# Patient Record
Sex: Male | Born: 1960 | Race: White | Hispanic: No | Smoking: Current every day smoker
Health system: Southern US, Community
[De-identification: ages and names within clinical notes are randomized; demographics above are authoritative.]

## PROBLEM LIST (undated history)

## (undated) DIAGNOSIS — I10 Essential (primary) hypertension: Secondary | ICD-10-CM

## (undated) DIAGNOSIS — E119 Type 2 diabetes mellitus without complications: Secondary | ICD-10-CM

## (undated) HISTORY — PX: HERNIA REPAIR: SHX51

## (undated) HISTORY — PX: REPAIR / RESECT / TRANSPLANT BICEPS TENDON: SUR1150

## (undated) HISTORY — PX: CORONARY ANGIOPLASTY WITH STENT PLACEMENT: SHX49

---

## 1997-08-26 ENCOUNTER — Ambulatory Visit (HOSPITAL_COMMUNITY): Admission: RE | Admit: 1997-08-26 | Discharge: 1997-08-26 | Payer: Self-pay | Admitting: Gastroenterology

## 1997-10-07 ENCOUNTER — Ambulatory Visit (HOSPITAL_COMMUNITY): Admission: RE | Admit: 1997-10-07 | Discharge: 1997-10-07 | Payer: Self-pay | Admitting: Gastroenterology

## 1999-06-16 ENCOUNTER — Encounter: Payer: Self-pay | Admitting: Emergency Medicine

## 1999-06-16 ENCOUNTER — Emergency Department (HOSPITAL_COMMUNITY): Admission: EM | Admit: 1999-06-16 | Discharge: 1999-06-16 | Payer: Self-pay | Admitting: Emergency Medicine

## 2000-05-13 ENCOUNTER — Ambulatory Visit (HOSPITAL_COMMUNITY): Admission: RE | Admit: 2000-05-13 | Discharge: 2000-05-13 | Payer: Self-pay | Admitting: Emergency Medicine

## 2000-05-13 ENCOUNTER — Encounter: Payer: Self-pay | Admitting: Emergency Medicine

## 2000-05-24 ENCOUNTER — Ambulatory Visit (HOSPITAL_COMMUNITY): Admission: RE | Admit: 2000-05-24 | Discharge: 2000-05-24 | Payer: Self-pay | Admitting: Urology

## 2005-02-16 ENCOUNTER — Ambulatory Visit: Payer: Self-pay | Admitting: Specialist

## 2005-03-02 ENCOUNTER — Ambulatory Visit: Payer: Self-pay | Admitting: Specialist

## 2011-08-03 ENCOUNTER — Telehealth: Payer: Self-pay

## 2011-08-03 NOTE — Telephone Encounter (Signed)
Previous note from 08/03/11 was an error and was documented on wrong pt

## 2011-08-03 NOTE — Telephone Encounter (Signed)
Pt called to cx appt scheduled for 7/12 with Dr. Mariah Milling.  He says he has appt with another cardiologist, Dr. Gwen Pounds, 08/17/11. Appt cx'd

## 2014-04-28 ENCOUNTER — Encounter: Payer: Self-pay | Admitting: Gastroenterology

## 2014-05-19 ENCOUNTER — Ambulatory Visit: Payer: Self-pay | Admitting: Nurse Practitioner

## 2014-08-12 ENCOUNTER — Encounter: Payer: Self-pay | Admitting: Gastroenterology

## 2014-09-09 ENCOUNTER — Ambulatory Visit: Payer: Self-pay | Admitting: Nurse Practitioner

## 2014-09-23 ENCOUNTER — Ambulatory Visit: Payer: Self-pay | Admitting: Nurse Practitioner

## 2014-09-23 ENCOUNTER — Telehealth: Payer: Self-pay | Admitting: Nurse Practitioner

## 2014-09-23 NOTE — Telephone Encounter (Signed)
Noted  

## 2014-09-23 NOTE — Telephone Encounter (Signed)
PATIENT DID NOT WANT TO HAVE APPOINTMENT AND FILE HIS INSURANCE.  DID NOT THINK HE SHOULD BE BILLED FOR A CONSULT WHEN HE WAS DECIDING ON IF HE WANTED TREATMENT. SAID HE HAD NOTHING ACTIVE AND WOULD TALK TO HIS PCP

## 2014-09-24 NOTE — Telephone Encounter (Signed)
Noted  

## 2017-03-23 ENCOUNTER — Ambulatory Visit
Admission: EM | Admit: 2017-03-23 | Discharge: 2017-03-23 | Disposition: A | Discharge status: Home / Self Care | Payer: Worker's Compensation

## 2017-11-29 ENCOUNTER — Encounter: Payer: Self-pay | Admitting: Emergency Medicine

## 2017-11-29 ENCOUNTER — Ambulatory Visit
Admission: EM | Admit: 2017-11-29 | Discharge: 2017-11-29 | Disposition: A | Discharge status: Home / Self Care | Payer: Managed Care, Other (non HMO) | Attending: Family Medicine | Admitting: Family Medicine

## 2017-11-29 ENCOUNTER — Other Ambulatory Visit: Payer: Self-pay

## 2017-11-29 ENCOUNTER — Ambulatory Visit (INDEPENDENT_AMBULATORY_CARE_PROVIDER_SITE_OTHER): Payer: Managed Care, Other (non HMO)

## 2017-11-29 DIAGNOSIS — R1012 Left upper quadrant pain: Secondary | ICD-10-CM

## 2017-11-29 HISTORY — DX: Type 2 diabetes mellitus without complications: E11.9

## 2017-11-29 HISTORY — DX: Essential (primary) hypertension: I10

## 2017-11-29 LAB — URINALYSIS, COMPLETE (UACMP) WITH MICROSCOPIC
Bacteria, UA: NONE SEEN
Bilirubin Urine: NEGATIVE
Glucose, UA: 500 mg/dL — AB
HGB URINE DIPSTICK: NEGATIVE
KETONES UR: NEGATIVE mg/dL
Leukocytes, UA: NEGATIVE
Nitrite: NEGATIVE
PROTEIN: NEGATIVE mg/dL
RBC / HPF: NONE SEEN RBC/hpf (ref 0–5)
Specific Gravity, Urine: 1.015 (ref 1.005–1.030)
WBC UA: NONE SEEN WBC/hpf (ref 0–5)
pH: 8.5 — ABNORMAL HIGH (ref 5.0–8.0)

## 2017-11-29 LAB — COMPREHENSIVE METABOLIC PANEL
ALBUMIN: 3.9 g/dL (ref 3.5–5.0)
ALT: 98 U/L — ABNORMAL HIGH (ref 0–44)
AST: 72 U/L — AB (ref 15–41)
Alkaline Phosphatase: 59 U/L (ref 38–126)
Anion gap: 9 (ref 5–15)
BUN: 17 mg/dL (ref 6–20)
CHLORIDE: 100 mmol/L (ref 98–111)
CO2: 26 mmol/L (ref 22–32)
Calcium: 9.1 mg/dL (ref 8.9–10.3)
Creatinine, Ser: 1.11 mg/dL (ref 0.61–1.24)
GFR calc Af Amer: >60 mL/min (ref >60)
GLUCOSE: 273 mg/dL — AB (ref 70–99)
POTASSIUM: 4 mmol/L (ref 3.5–5.1)
Sodium: 135 mmol/L (ref 135–145)
Total Bilirubin: 1.1 mg/dL (ref 0.3–1.2)
Total Protein: 7 g/dL (ref 6.5–8.1)

## 2017-11-29 LAB — LIPASE, BLOOD: Lipase: 48 U/L (ref 11–51)

## 2017-11-29 NOTE — ED Triage Notes (Signed)
Pt c/o LUQ abdominal pain that radiates through to his back and into his groin. He also has nausea, and vomiting. He reports that this morning he had pain that doubled him over but the pain has been getting worse over the last 2-3 days. He thought the pain was coming from his Januvia so he stopped it a couple of days ago.

## 2017-11-29 NOTE — ED Provider Notes (Signed)
[facility]    CSN: [id number] Arrival date & time: 11/29/17  1456     History   Chief Complaint Chief Complaint  Patient presents with  . Abdominal Pain  . Back Pain    HPI Lucas Gregory is a 57 y.o. male.   57 yo male with a c/o left upper abdominal pain associated with nausea and worsening over the last 2-3 days. Denies any fevers, chills, dysuria, hematuria, diarrhea, constipation, injuries. States he thinks this is side effect from Januvia medication that he takes.   The history is provided by the patient.  Abdominal Pain  Back Pain  Associated symptoms: abdominal pain     Past Medical History:  Diagnosis Date  . Diabetes mellitus without complication (HCC)   . Hypertension     There are no active problems to display for this patient.   Past Surgical History:  Procedure Laterality Date  . CORONARY ANGIOPLASTY WITH STENT PLACEMENT    . HERNIA REPAIR    . REPAIR / RESECT / TRANSPLANT BICEPS TENDON         Home Medications    Prior to Admission medications   Medication Sig Start Date End Date Taking? Authorizing Provider  atorvastatin (LIPITOR) 40 MG tablet  10/31/17  Yes [provider]  enalapril (VASOTEC) 20 MG tablet  10/31/17  Yes [provider]  glyBURIDE (DIABETA) 5 MG tablet  10/31/17  Yes [provider]  metFORMIN (GLUCOPHAGE) 500 MG tablet  10/31/17  Yes [provider]  JANUVIA 100 MG tablet  09/29/17   [provider]    Family History Family History  Problem Relation Age of Onset  . Cancer Mother   . Stroke Father     Social History Social History   Tobacco Use  . Smoking status: Current Every Day Smoker    Packs/day: 0.50  . Smokeless tobacco: Never Used  Substance Use Topics  . Alcohol use: Yes    Frequency: Never  . Drug use: Never     Allergies   Pork-derived products; Beef-derived products; and Hepatitis b virus vaccine   Review of Systems Review of Systems    Gastrointestinal: Positive for abdominal pain.  Musculoskeletal: Positive for back pain.     Physical Exam Triage Vital Signs ED Triage Vitals  Enc Vitals Group     BP 11/29/17 1520 126/73     Pulse Rate 11/29/17 1520 74     Resp 11/29/17 1520 18     Temp 11/29/17 1520 98.6 F (37 C)     Temp Source 11/29/17 1520 Oral     SpO2 11/29/17 1520 97 %     Weight 11/29/17 1512 190 lb (86.2 kg)     Height 11/29/17 1512 5\' 11"  (1.803 m)     Head Circumference --      Peak Flow --      Pain Score 11/29/17 1512 7     Pain Loc --      Pain Edu? --      Excl. in GC? --    No data found.  Updated Vital Signs BP 126/73 (BP Location: Left Arm)   Pulse 74   Temp 98.6 F (37 C) (Oral)   Resp 18   Ht 5\' 11"  (1.803 m)   Wt 86.2 kg   SpO2 97%   BMI 26.50 kg/m   Visual Acuity Right Eye Distance:   Left Eye Distance:   Bilateral Distance:    Right Eye Near:  Left Eye Near:    Bilateral Near:     Physical Exam  Constitutional: He is oriented to person, place, and time. He appears well-developed and well-nourished. No distress.  HENT:  Head: Normocephalic and atraumatic.  Cardiovascular: Normal rate, regular rhythm, normal heart sounds and intact distal pulses.  No murmur heard. Pulmonary/Chest: Effort normal and breath sounds normal. No respiratory distress. He has no wheezes. He has no rales.  Abdominal: Soft. Bowel sounds are normal. He exhibits no distension and no mass. There is tenderness (to left upper quadrant; no rebound or guarding). There is no rebound and no guarding.  Neurological: He is alert and oriented to person, place, and time.  Skin: No rash noted. He is not diaphoretic.  Nursing note and vitals reviewed.    UC Treatments / Results  Labs (all labs ordered are listed, but only abnormal results are displayed) Labs Reviewed  COMPREHENSIVE METABOLIC PANEL - Abnormal; Notable for the following components:      Result Value   Glucose, Bld 273 (*)    AST 72  (*)    ALT 98 (*)    All other components within normal limits  URINALYSIS, COMPLETE (UACMP) WITH MICROSCOPIC - Abnormal; Notable for the following components:   pH 8.5 (*)    Glucose, UA 500 (*)    All other components within normal limits  LIPASE, BLOOD    EKG None  Radiology Dg Abd 2 Views  Result Date: 11/29/2017 CLINICAL DATA:  Left upper quadrant pain with nausea and vomiting for the past 2-3 days. EXAM: ABDOMEN - 2 VIEW COMPARISON:  None. FINDINGS: There is a single loop of mildly dilated, air-filled small bowel in the left abdomen. No air-fluid levels. Air and stool are noted throughout the colon. There is no evidence of free intraperitoneal air. Cholelithiasis. Vascular calcifications. No acute osseous abnormality. IMPRESSION: 1. Nonspecific bowel-gas pattern with single loop of mildly dilated, air-filled small bowel in the left abdomen. No definite evidence of obstruction. 2. Cholelithiasis. Electronically Signed   By: Obie Dredge M.D.   On: 11/29/2017 16:40    Procedures Procedures (including critical care time)  Medications Ordered in UC Medications - No data to display  Initial Impression / Assessment and Plan / UC Course  I have reviewed the triage vital signs and the nursing notes.  Pertinent labs & imaging results that were available during my care of the patient were reviewed by me and considered in my medical decision making (see chart for details).      Final Clinical Impressions(s) / UC Diagnoses   Final diagnoses:  Abdominal pain, left upper quadrant     Discharge Instructions     Follow up with Primary care provider Go to Emergency Department if symptoms worsen    ED Prescriptions    None      1. Labs/x-ray results and diagnosis reviewed with patient 2. Recommend supportive treatment with continue current medications and follow up with PCP; go to ED if symptoms worsen 3. Follow-up prn  Controlled Substance Prescriptions [place] Controlled  Substance Registry consulted? Not Applicable   Payton Mccallum, MD 11/29/17 [phone]

## 2017-11-29 NOTE — Discharge Instructions (Addendum)
Follow up with Primary care provider Go to Emergency Department if symptoms worsen

## 2017-11-30 ENCOUNTER — Emergency Department: Payer: Managed Care, Other (non HMO)

## 2017-11-30 ENCOUNTER — Emergency Department
Admission: EM | Admit: 2017-11-30 | Discharge: 2017-11-30 | Disposition: A | Discharge status: Home / Self Care | Payer: Managed Care, Other (non HMO) | Attending: Emergency Medicine | Admitting: Emergency Medicine

## 2017-11-30 ENCOUNTER — Encounter: Payer: Self-pay | Admitting: Emergency Medicine

## 2017-11-30 ENCOUNTER — Other Ambulatory Visit: Payer: Self-pay

## 2017-11-30 DIAGNOSIS — I10 Essential (primary) hypertension: Secondary | ICD-10-CM | POA: Diagnosis not present

## 2017-11-30 DIAGNOSIS — Z955 Presence of coronary angioplasty implant and graft: Secondary | ICD-10-CM | POA: Insufficient documentation

## 2017-11-30 DIAGNOSIS — F17200 Nicotine dependence, unspecified, uncomplicated: Secondary | ICD-10-CM | POA: Insufficient documentation

## 2017-11-30 DIAGNOSIS — R1032 Left lower quadrant pain: Secondary | ICD-10-CM | POA: Diagnosis present

## 2017-11-30 DIAGNOSIS — K8500 Idiopathic acute pancreatitis without necrosis or infection: Secondary | ICD-10-CM

## 2017-11-30 DIAGNOSIS — E119 Type 2 diabetes mellitus without complications: Secondary | ICD-10-CM | POA: Diagnosis not present

## 2017-11-30 LAB — CBC
HEMATOCRIT: 43.3 % (ref 39.0–52.0)
HEMOGLOBIN: 15.2 g/dL (ref 13.0–17.0)
MCH: 32.4 pg (ref 26.0–34.0)
MCHC: 35.1 g/dL (ref 30.0–36.0)
MCV: 92.3 fL (ref 80.0–100.0)
NRBC: 0 % (ref 0.0–0.2)
Platelets: 166 10*3/uL (ref 150–400)
RBC: 4.69 MIL/uL (ref 4.22–5.81)
RDW: 12.4 % (ref 11.5–15.5)
WBC: 10.3 10*3/uL (ref 4.0–10.5)

## 2017-11-30 LAB — COMPREHENSIVE METABOLIC PANEL
ALT: 93 U/L — AB (ref 0–44)
AST: 64 U/L — AB (ref 15–41)
Albumin: 4 g/dL (ref 3.5–5.0)
Alkaline Phosphatase: 61 U/L (ref 38–126)
Anion gap: 8 (ref 5–15)
BUN: 15 mg/dL (ref 6–20)
CHLORIDE: 103 mmol/L (ref 98–111)
CO2: 24 mmol/L (ref 22–32)
CREATININE: 0.94 mg/dL (ref 0.61–1.24)
Calcium: 9.3 mg/dL (ref 8.9–10.3)
GFR calc non Af Amer: >60 mL/min (ref >60)
Glucose, Bld: 218 mg/dL — ABNORMAL HIGH (ref 70–99)
POTASSIUM: 4.2 mmol/L (ref 3.5–5.1)
SODIUM: 135 mmol/L (ref 135–145)
Total Bilirubin: 0.9 mg/dL (ref 0.3–1.2)
Total Protein: 7.3 g/dL (ref 6.5–8.1)

## 2017-11-30 LAB — LIPASE, BLOOD: Lipase: 52 U/L — ABNORMAL HIGH (ref 11–51)

## 2017-11-30 MED ORDER — KETOROLAC TROMETHAMINE 30 MG/ML IJ SOLN
30.0000 mg | Freq: Once | INTRAMUSCULAR | Status: AC
  Administered 2017-11-30: 30 mg via INTRAVENOUS
  Filled 2017-11-30: qty 1

## 2017-11-30 MED ORDER — IOPAMIDOL (ISOVUE-300) INJECTION 61%
100.0000 mL | Freq: Once | INTRAVENOUS | Status: AC | PRN
  Administered 2017-11-30: 100 mL via INTRAVENOUS

## 2017-11-30 NOTE — ED Notes (Signed)
Patient transported to CT 

## 2017-11-30 NOTE — ED Notes (Signed)
MD at bedside. 

## 2017-11-30 NOTE — ED Notes (Signed)
Pt back from CT

## 2017-11-30 NOTE — ED Notes (Signed)
Pt states left upper/mid abdominal pain that shoots to his groin and straight back to left of his spine. Pt describes pain as sharp pulsating. Pt states one episode of vomiting yesterday. Pt denies any blood in stool, urine or emesis.

## 2017-11-30 NOTE — ED Triage Notes (Signed)
Pt arrived with complaints of left sided abdominal pain that started 3 months prior. Pt states the pain stated as soon as he took Januvia. Pt states the pain initially was intermittent and became constant Sunday. Pt contacted PCP and told them he stopped his Januvia. Pt states yesterday he had shooting mid back pain that radiates to his groin. Pt was directed to go to urgent care, urgent care stated pt needed an MRI but pt could not schedule MRI with PCP and was told to come to ED for MRI. Pt also reports 1 episode of emesis yesterday. Pt denies chest pain or shortness of breath.

## 2017-11-30 NOTE — ED Provider Notes (Signed)
[facility] Emergency Department Provider Note       Time seen: ----------------------------------------- 3:44 PM on 11/30/2017 -----------------------------------------   I have reviewed the triage vital signs and the nursing notes.  HISTORY   Chief Complaint Abdominal Pain    HPI Lucas Gregory is a 57 y.o. male with a history of diabetes, hypertension, coronary artery disease who presents to the ED for left lower quadrant pain that shoots into his groin as well as into his back.  He had one episode of vomiting yesterday.  He describes it as sharp and pulsating.  He has had intermittent intermittent pain for the past 3 months, he denies fevers, chills or other complaints.  Past Medical History:  Diagnosis Date  . Diabetes mellitus without complication (HCC)   . Hypertension     There are no active problems to display for this patient.   Past Surgical History:  Procedure Laterality Date  . CORONARY ANGIOPLASTY WITH STENT PLACEMENT    . HERNIA REPAIR    . REPAIR / RESECT / TRANSPLANT BICEPS TENDON      Allergies Pork-derived products; Beef-derived products; and Hepatitis b virus vaccine  Social History Social History   Tobacco Use  . Smoking status: Current Every Day Smoker    Packs/day: 0.50  . Smokeless tobacco: Never Used  Substance Use Topics  . Alcohol use: Yes    Frequency: Never  . Drug use: Never   Review of Systems Constitutional: Negative for fever. Cardiovascular: Negative for chest pain. Respiratory: Negative for shortness of breath. Gastrointestinal: Positive for abdominal pain, recent vomiting Musculoskeletal: Positive for back pain Skin: Negative for rash. Neurological: Negative for headaches, focal weakness or numbness.  All systems negative/normal/unremarkable except as stated in the HPI  ____________________________________________   PHYSICAL EXAM:  VITAL SIGNS: ED Triage Vitals [11/30/17 1131]  Enc Vitals  Group     BP (!) 154/89     Pulse Rate 72     Resp 18     Temp 98.2 F (36.8 C)     Temp Source Oral     SpO2 98 %     Weight 190 lb (86.2 kg)     Height 5\' 11"  (1.803 m)     Head Circumference      Peak Flow      Pain Score 5     Pain Loc      Pain Edu?      Excl. in GC?    Constitutional: Alert and oriented. Well appearing and in no distress. Eyes: Conjunctivae are normal. Normal extraocular movements. Cardiovascular: Normal rate, regular rhythm. No murmurs, rubs, or gallops. Respiratory: Normal respiratory effort without tachypnea nor retractions. Breath sounds are clear and equal bilaterally. No wheezes/rales/rhonchi. Gastrointestinal: Left upper quadrant tenderness, no rebound or guarding.  Normal bowel sounds. Musculoskeletal: Nontender with normal range of motion in extremities. No lower extremity tenderness nor edema. Neurologic:  Normal speech and language. No gross focal neurologic deficits are appreciated.  Skin:  Skin is warm, dry and intact. No rash noted. Psychiatric: Mood and affect are normal. Speech and behavior are normal.  ____________________________________________  ED COURSE:  As part of my medical decision making, I reviewed the following data within the electronic MEDICAL RECORD NUMBER History obtained from family if available, nursing notes, old chart and ekg, as well as notes from prior ED visits. Patient presented for abdominal pain, we will assess with labs and imaging as indicated at this time.   Procedures ____________________________________________  LABS (pertinent positives/negatives)  Labs Reviewed  LIPASE, BLOOD - Abnormal; Notable for the following components:      Result Value   Lipase 52 (*)    All other components within normal limits  COMPREHENSIVE METABOLIC PANEL - Abnormal; Notable for the following components:   Glucose, Bld 218 (*)    AST 64 (*)    ALT 93 (*)    All other components within normal limits  CBC  URINALYSIS,  COMPLETE (UACMP) WITH MICROSCOPIC    RADIOLOGY Images were viewed by me  CT the abdomen pelvis with contrast IMPRESSION: Small BILATERAL inguinal hernias containing fat.  Prostatic enlargement.  Cholelithiasis.  16 mm LEFT renal cyst.  6 mm pancreatic tail cyst, recommendation below.  Follow-up MR imaging is recommended in 1 year to characterize and assess stability.  This recommendation follows ACR consensus guidelines: Management of Incidental Pancreatic Cysts: A White Paper of the ACR Incidental Findings Committee. J Am Coll Radiol 2017;14:911-923.  Aortic Atherosclerosis (ICD10-I70.0). ____________________________________________  DIFFERENTIAL DIAGNOSIS   Pancreatitis, GERD, peptic ulcer disease, colitis, occult cancer  FINAL ASSESSMENT AND PLAN  Abdominal pain, mild pancreatitis   Plan: The patient had presented for abdominal pain. Patient's did reveal mild elevations in his LFTs and lipase. Patient's imaging did reveal a small pancreatic cyst.  I did discuss this with him and he will need imaging again in a year.  He describes a history of heavy alcoholism but not recently.  He drinks 2-3 drinks a week.  He also has been taking Januvia which has a problem regarding pancreatitis.  He is been advised to stop drinking as well as taking Januvia and switch back to his metformin.  He is cleared for outpatient follow-up.   Ulice Dash, MD   Note: This note was generated in part or whole with voice recognition software. Voice recognition is usually quite accurate but there are transcription errors that can and very often do occur. I apologize for any typographical errors that were not detected and corrected.     Emily Filbert, MD 11/30/17 1714

## 2019-02-25 ENCOUNTER — Other Ambulatory Visit: Payer: Self-pay | Admitting: Nurse Practitioner

## 2019-02-25 DIAGNOSIS — M545 Low back pain, unspecified: Secondary | ICD-10-CM

## 2019-02-25 DIAGNOSIS — R634 Abnormal weight loss: Secondary | ICD-10-CM

## 2019-02-26 ENCOUNTER — Other Ambulatory Visit: Payer: Self-pay | Admitting: Nurse Practitioner

## 2019-02-26 DIAGNOSIS — K862 Cyst of pancreas: Secondary | ICD-10-CM

## 2019-03-08 ENCOUNTER — Ambulatory Visit
Admission: RE | Admit: 2019-03-08 | Discharge: 2019-03-08 | Disposition: A | Discharge status: Home / Self Care | Payer: BC Managed Care – PPO | Source: Ambulatory Visit | Attending: Nurse Practitioner | Admitting: Nurse Practitioner

## 2019-03-08 ENCOUNTER — Other Ambulatory Visit: Payer: Self-pay

## 2019-03-08 DIAGNOSIS — M545 Low back pain, unspecified: Secondary | ICD-10-CM

## 2019-03-08 DIAGNOSIS — R634 Abnormal weight loss: Secondary | ICD-10-CM

## 2019-03-08 DIAGNOSIS — K862 Cyst of pancreas: Secondary | ICD-10-CM | POA: Insufficient documentation

## 2019-03-08 MED ORDER — GADOBUTROL 1 MMOL/ML IV SOLN
9.0000 mL | Freq: Once | INTRAVENOUS | Status: AC | PRN
  Administered 2019-03-08: 9 mL via INTRAVENOUS

## 2019-03-11 LAB — POCT I-STAT CREATININE: Creatinine, Ser: 1 mg/dL (ref 0.61–1.24)

## 2019-11-19 ENCOUNTER — Ambulatory Visit (INDEPENDENT_AMBULATORY_CARE_PROVIDER_SITE_OTHER): Payer: BC Managed Care – PPO

## 2019-11-19 ENCOUNTER — Encounter: Payer: Self-pay | Admitting: Emergency Medicine

## 2019-11-19 ENCOUNTER — Other Ambulatory Visit: Payer: Self-pay

## 2019-11-19 ENCOUNTER — Ambulatory Visit
Admission: EM | Admit: 2019-11-19 | Discharge: 2019-11-19 | Disposition: A | Discharge status: Home / Self Care | Payer: BC Managed Care – PPO | Attending: Family Medicine | Admitting: Family Medicine

## 2019-11-19 DIAGNOSIS — M79641 Pain in right hand: Secondary | ICD-10-CM

## 2019-11-19 DIAGNOSIS — R079 Chest pain, unspecified: Secondary | ICD-10-CM | POA: Diagnosis not present

## 2019-11-19 DIAGNOSIS — S62346A Nondisplaced fracture of base of fifth metacarpal bone, right hand, initial encounter for closed fracture: Secondary | ICD-10-CM

## 2019-11-19 DIAGNOSIS — M25531 Pain in right wrist: Secondary | ICD-10-CM | POA: Diagnosis not present

## 2019-11-19 DIAGNOSIS — M25561 Pain in right knee: Secondary | ICD-10-CM

## 2019-11-19 MED ORDER — TRAMADOL HCL 50 MG PO TABS: 50.0000 mg | ORAL_TABLET | Freq: Three times a day (TID) | ORAL | 0 refills | Status: AC | PRN
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
## 2019-11-19 NOTE — Discharge Instructions (Signed)
Rest, elevate.  Pain medication as directed.  Please call [facility] Orthopedics [phone]) OR [facility] [phone]) for an appt.  Take care  Dr. Adriana Simas

## 2020-01-14 ENCOUNTER — Other Ambulatory Visit: Payer: Self-pay

## 2020-01-14 ENCOUNTER — Emergency Department: Payer: BC Managed Care – PPO

## 2020-01-14 ENCOUNTER — Emergency Department
Admission: EM | Admit: 2020-01-14 | Discharge: 2020-01-14 | Disposition: A | Discharge status: Home / Self Care | Payer: BC Managed Care – PPO | Attending: Emergency Medicine | Admitting: Emergency Medicine

## 2020-01-14 DIAGNOSIS — Z79899 Other long term (current) drug therapy: Secondary | ICD-10-CM | POA: Diagnosis not present

## 2020-01-14 DIAGNOSIS — I10 Essential (primary) hypertension: Secondary | ICD-10-CM | POA: Insufficient documentation

## 2020-01-14 DIAGNOSIS — Z7982 Long term (current) use of aspirin: Secondary | ICD-10-CM | POA: Insufficient documentation

## 2020-01-14 DIAGNOSIS — R42 Dizziness and giddiness: Secondary | ICD-10-CM

## 2020-01-14 DIAGNOSIS — Z7984 Long term (current) use of oral hypoglycemic drugs: Secondary | ICD-10-CM | POA: Insufficient documentation

## 2020-01-14 DIAGNOSIS — F17200 Nicotine dependence, unspecified, uncomplicated: Secondary | ICD-10-CM | POA: Diagnosis not present

## 2020-01-14 DIAGNOSIS — E119 Type 2 diabetes mellitus without complications: Secondary | ICD-10-CM | POA: Diagnosis not present

## 2020-01-14 DIAGNOSIS — M4692 Unspecified inflammatory spondylopathy, cervical region: Secondary | ICD-10-CM | POA: Diagnosis not present

## 2020-01-14 DIAGNOSIS — M47812 Spondylosis without myelopathy or radiculopathy, cervical region: Secondary | ICD-10-CM

## 2020-01-14 LAB — CBC
HCT: 43.8 % (ref 39.0–52.0)
Hemoglobin: 15.4 g/dL (ref 13.0–17.0)
MCH: 32.2 pg (ref 26.0–34.0)
MCHC: 35.2 g/dL (ref 30.0–36.0)
MCV: 91.4 fL (ref 80.0–100.0)
Platelets: 203 10*3/uL (ref 150–400)
RBC: 4.79 MIL/uL (ref 4.22–5.81)
RDW: 12.5 % (ref 11.5–15.5)
WBC: 14.9 10*3/uL — ABNORMAL HIGH (ref 4.0–10.5)
nRBC: 0 % (ref 0.0–0.2)

## 2020-01-14 LAB — BASIC METABOLIC PANEL
Anion gap: 12 (ref 5–15)
BUN: 22 mg/dL — ABNORMAL HIGH (ref 6–20)
CO2: 25 mmol/L (ref 22–32)
Calcium: 10.4 mg/dL — ABNORMAL HIGH (ref 8.9–10.3)
Chloride: 93 mmol/L — ABNORMAL LOW (ref 98–111)
Creatinine, Ser: 1.22 mg/dL (ref 0.61–1.24)
GFR, Estimated: >60 mL/min (ref >60)
Glucose, Bld: 207 mg/dL — ABNORMAL HIGH (ref 70–99)
Potassium: 4.6 mmol/L (ref 3.5–5.1)
Sodium: 130 mmol/L — ABNORMAL LOW (ref 135–145)

## 2020-01-14 MED ORDER — SODIUM CHLORIDE 0.9 % IV BOLUS
1000.0000 mL | Freq: Once | INTRAVENOUS | Status: AC
  Administered 2020-01-14: 17:00:00 1000 mL via INTRAVENOUS

## 2020-01-14 NOTE — ED Notes (Signed)
RN at bedside. Pt c/o sudden onset of acid reflux. MD made aware.
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
## 2020-01-14 NOTE — ED Notes (Signed)
Pt to MRI at this time.
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
## 2021-06-28 ENCOUNTER — Other Ambulatory Visit: Payer: Self-pay | Admitting: Family Medicine

## 2021-06-28 ENCOUNTER — Other Ambulatory Visit (HOSPITAL_COMMUNITY): Payer: Self-pay | Admitting: Family Medicine

## 2021-06-28 DIAGNOSIS — R1012 Left upper quadrant pain: Secondary | ICD-10-CM

## 2021-07-09 ENCOUNTER — Ambulatory Visit (HOSPITAL_COMMUNITY)
Admission: RE | Admit: 2021-07-09 | Discharge: 2021-07-09 | Disposition: A | Discharge status: Home / Self Care | Payer: BC Managed Care – PPO | Source: Ambulatory Visit | Attending: Family Medicine | Admitting: Family Medicine

## 2021-07-09 DIAGNOSIS — R1012 Left upper quadrant pain: Secondary | ICD-10-CM | POA: Insufficient documentation

## 2021-07-19 ENCOUNTER — Other Ambulatory Visit: Payer: Self-pay | Admitting: Nurse Practitioner

## 2021-07-19 ENCOUNTER — Other Ambulatory Visit (HOSPITAL_COMMUNITY): Payer: Self-pay | Admitting: Nurse Practitioner

## 2021-07-19 DIAGNOSIS — K769 Liver disease, unspecified: Secondary | ICD-10-CM

## 2021-08-04 ENCOUNTER — Encounter (HOSPITAL_COMMUNITY): Payer: Self-pay

## 2021-08-04 ENCOUNTER — Ambulatory Visit (HOSPITAL_COMMUNITY): Payer: BC Managed Care – PPO

## 2021-11-29 ENCOUNTER — Encounter: Payer: Self-pay | Admitting: Internal Medicine

## 2021-12-31 ENCOUNTER — Ambulatory Visit: Payer: BLUE CROSS/BLUE SHIELD | Admitting: Gastroenterology

## 2022-02-01 ENCOUNTER — Ambulatory Visit: Payer: BLUE CROSS/BLUE SHIELD | Admitting: Gastroenterology

## 2022-02-04 ENCOUNTER — Ambulatory Visit: Payer: BLUE CROSS/BLUE SHIELD | Admitting: Gastroenterology

## 2024-03-11 IMAGING — US US ABDOMEN COMPLETE
1 series · 15 of 25 positions shown · non-contrast
Comparison: None Available.

CLINICAL DATA: Left upper quadrant pain

EXAM:
ABDOMEN ULTRASOUND COMPLETE

[Series 1: us abdomen complete mc & wl · 15 of 144 slices shown]
[im 1/144]
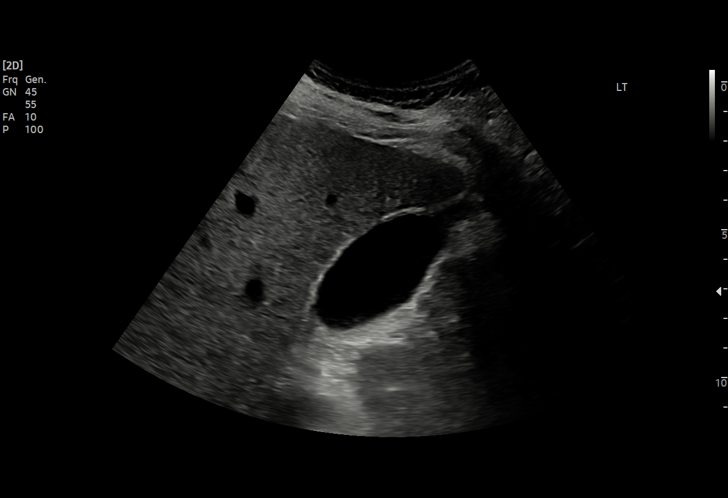
[im 12/144]
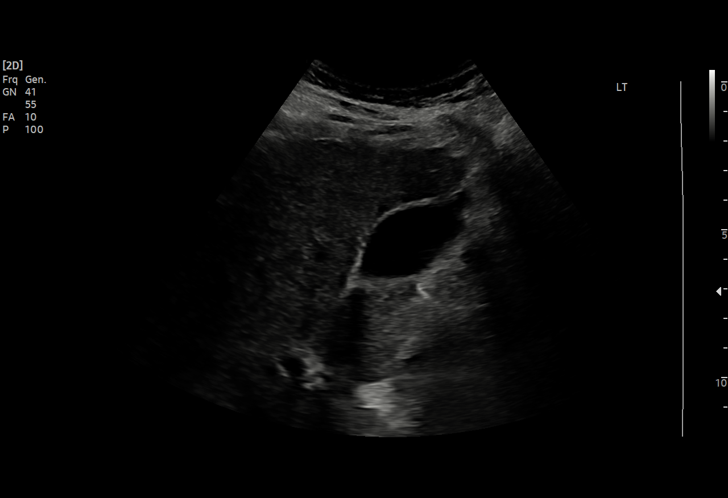
[im 24/144]
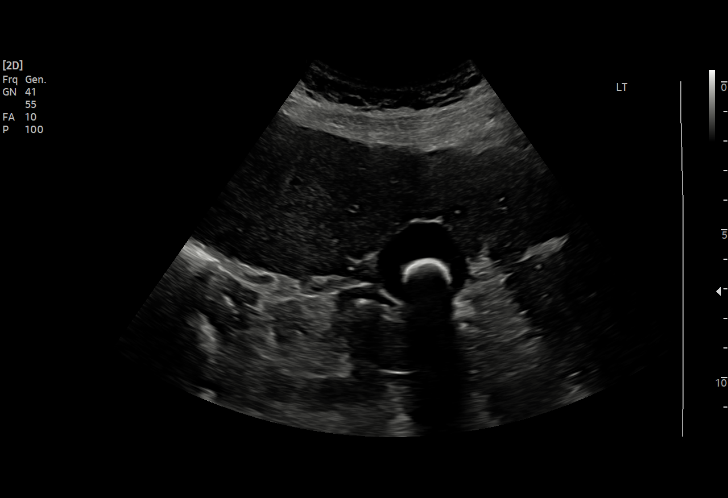
[im 30/144]
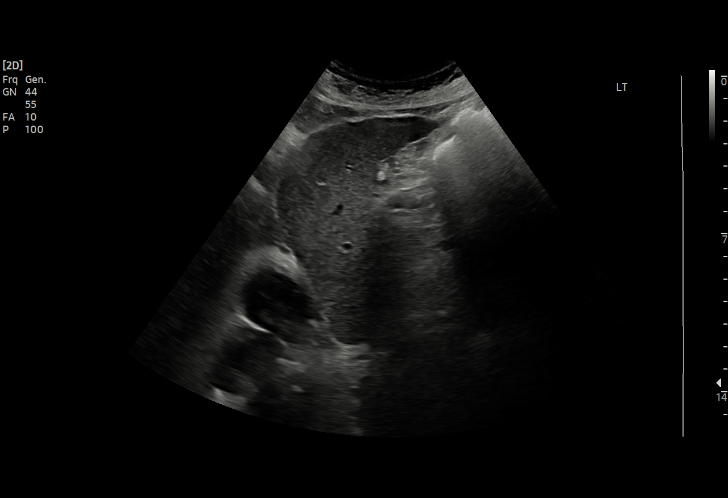
[im 42/144]
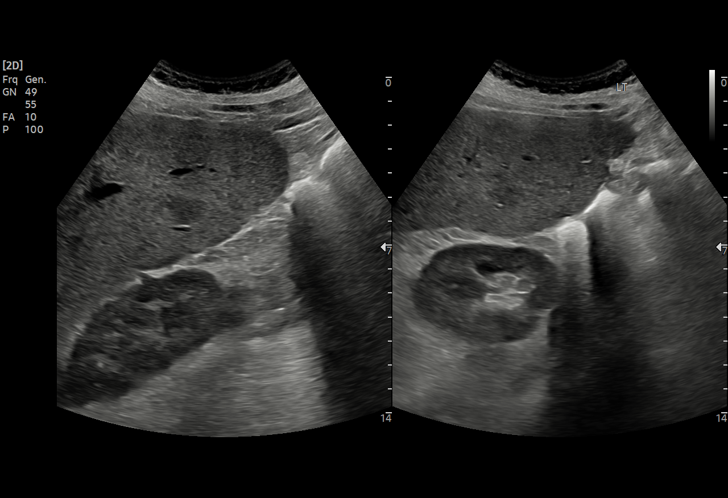
[im 54/144]
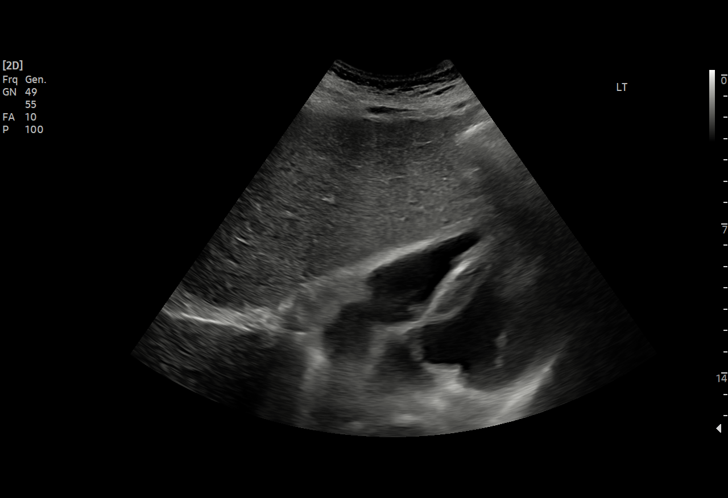
[im 60/144]
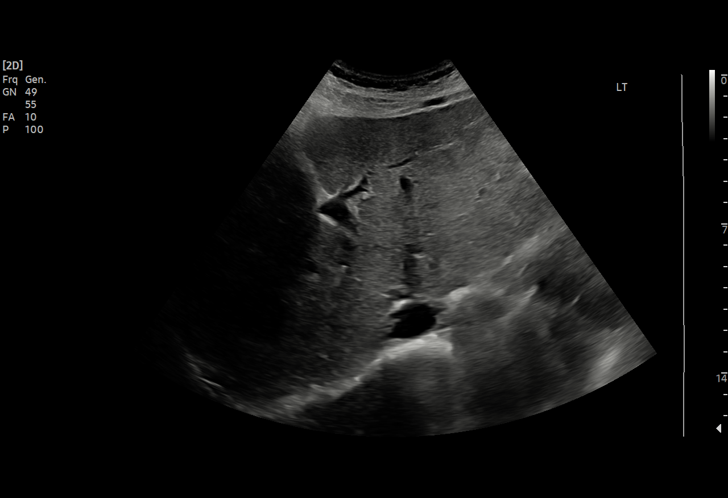
[im 72/144]
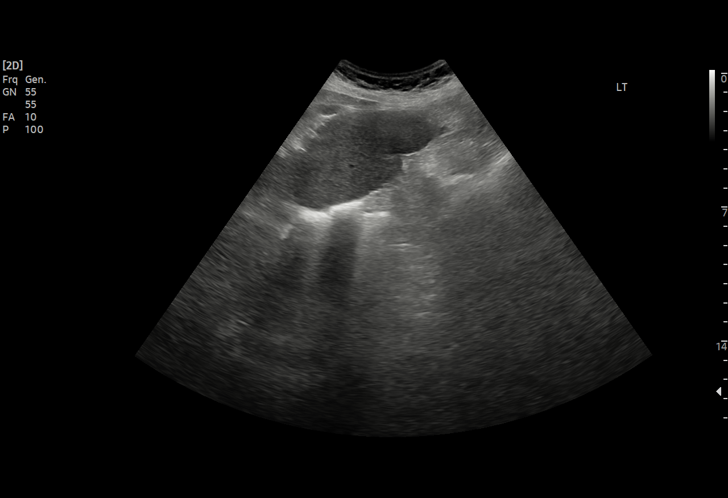
[im 84/144]
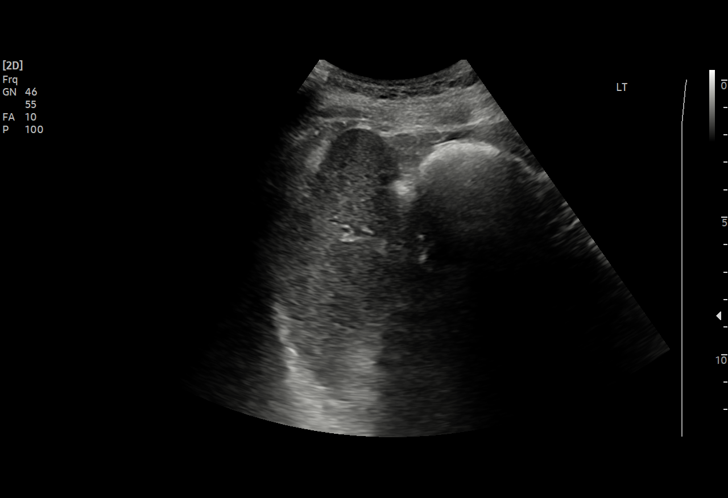
[im 90/144]
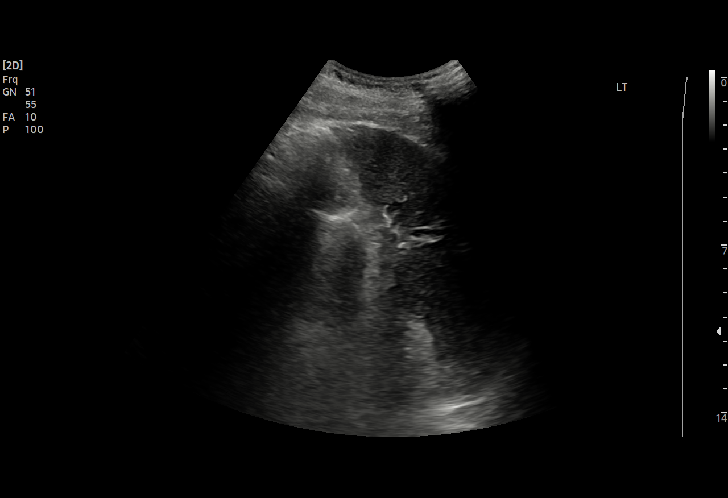
[im 102/144]
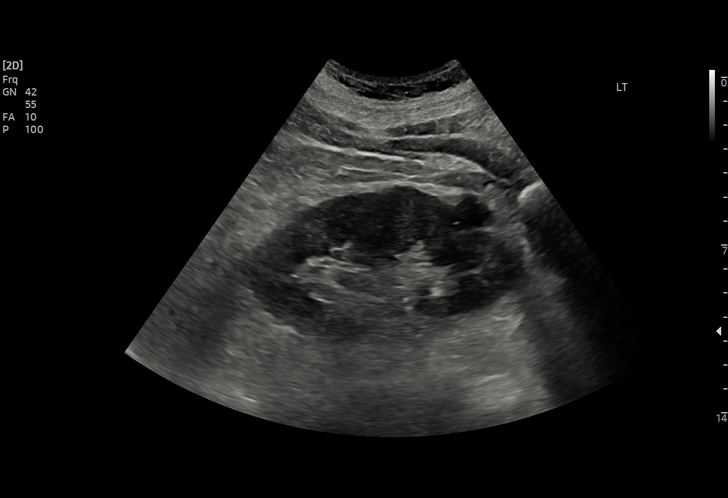
[im 114/144]
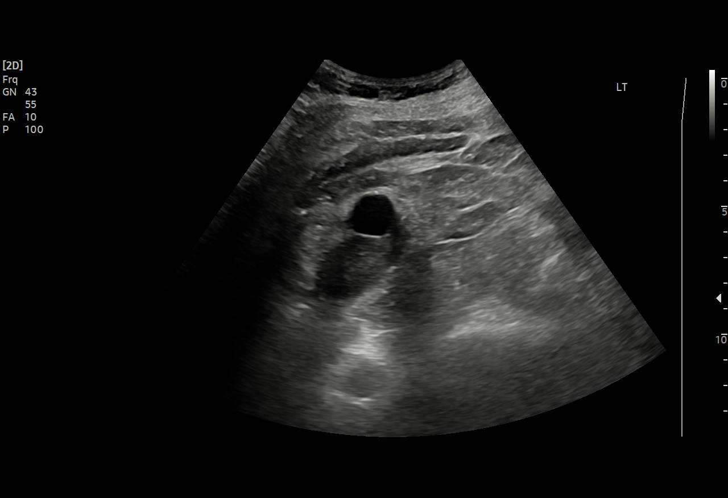
[im 120/144]
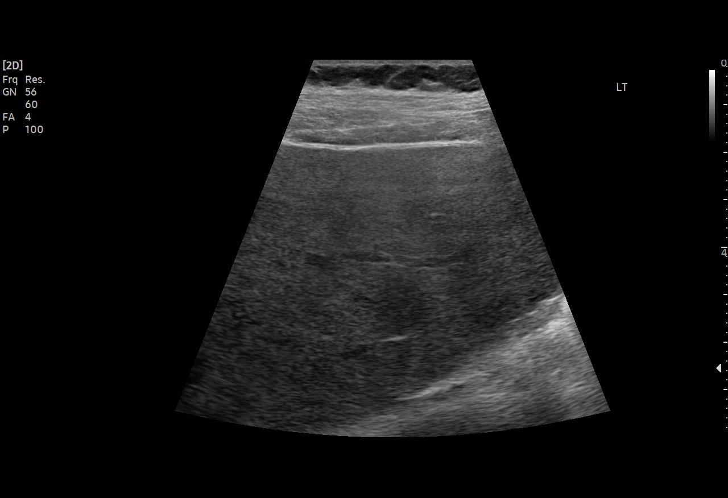
[im 132/144]
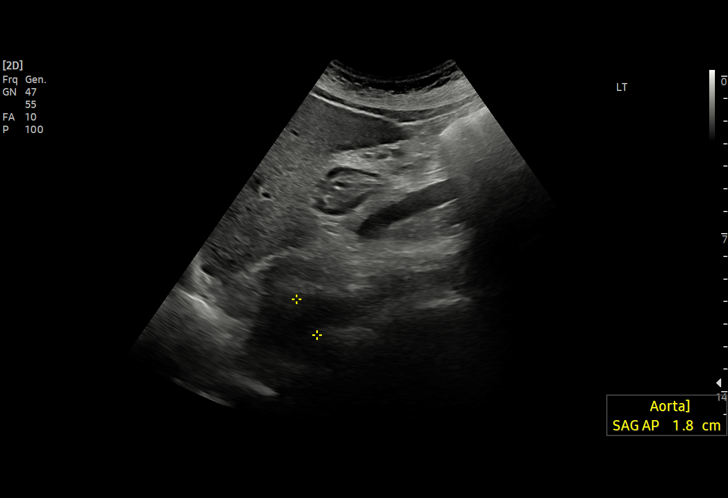
[im 144/144]
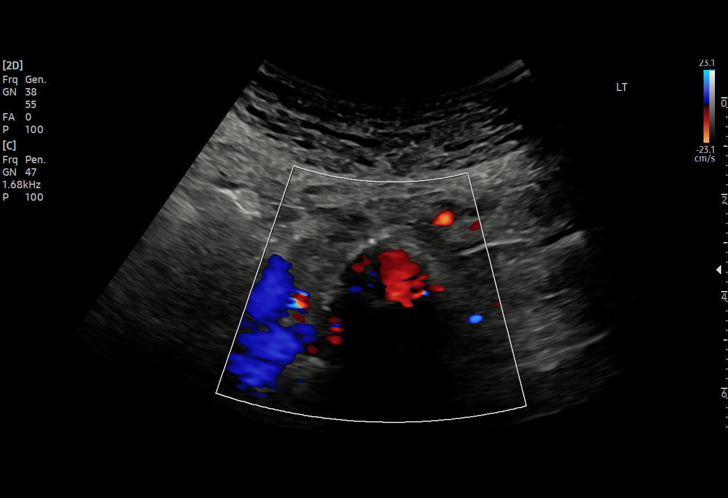

[15 of 25 positions shown; findings below may reference images not displayed]

FINDINGS: Gallbladder: Echogenic shadowing calculi measuring up to 2.1 cm. No
wall thickening, pericholecystic fluid, or sonographic Murphy's
sign.

Common bile duct: Diameter: 3 mm

Liver: Mildly increased echogenicity of the parenchyma.
Indeterminate mildly hypoechoic lesion in the right hepatic lobe
measuring 1.3 x 1.7 x 1.5 cm. Portal vein is patent on color Doppler
imaging with normal direction of blood flow towards the liver.

IVC: No abnormality visualized.

Pancreas: Visualized portion unremarkable.

Spleen: Size and appearance within normal limits.

Right Kidney: Length: 11.5 cm. Echogenicity within normal limits. No
mass or hydronephrosis visualized.

Left Kidney: Length: 12.4 cm. Echogenicity within normal limits.
cm anechoic cyst with a thin partial septation in the lower pole. No
hydronephrosis visualized.

Abdominal aorta: Aneurysmal dilatation of the distal abdominal aorta
measuring 3.3 cm in diameter.

Other findings: None.
IMPRESSION: 1. Cholelithiasis.
2. Mildly increased echogenicity of the liver parenchyma which could
represent hepatic steatosis and/or other hepatocellular disease.
Small indeterminate hypoechoic lesion in the right hepatic lobe,
consider follow-up MRI abdomen with contrast.
3. Minimally complex left renal cortical cysts.
4. Distal abdominal aortic aneurysm measuring 3.3 cm diameter.
Recommend follow-up ultrasound every 3 years. This recommendation
follows ACR consensus guidelines: White Paper of the ACR Incidental
